# Patient Record
Sex: Male | Born: 1980 | Race: White | Hispanic: No | Marital: Married
Health system: Midwestern US, Academic
[De-identification: ages and names within clinical notes are randomized; demographics above are authoritative.]

---

## 2018-01-01 ENCOUNTER — Encounter: Admit: 2018-01-01 | Discharge: 2018-01-02

## 2018-01-01 DIAGNOSIS — R69 Illness, unspecified: Principal | ICD-10-CM

## 2018-02-05 ENCOUNTER — Encounter: Admit: 2018-02-05 | Discharge: 2018-02-05

## 2018-02-05 DIAGNOSIS — R69 Illness, unspecified: Principal | ICD-10-CM

## 2018-02-25 ENCOUNTER — Encounter: Admit: 2018-02-25 | Discharge: 2018-02-25

## 2018-03-04 ENCOUNTER — Encounter: Admit: 2018-03-04 | Discharge: 2018-03-04

## 2018-03-04 ENCOUNTER — Encounter: Admit: 2018-03-04 | Discharge: 2018-03-05

## 2018-03-04 DIAGNOSIS — R69 Illness, unspecified: Principal | ICD-10-CM

## 2018-03-05 ENCOUNTER — Encounter: Admit: 2018-03-05 | Discharge: 2018-03-05

## 2018-03-05 DIAGNOSIS — R51 Headache: Principal | ICD-10-CM

## 2018-03-05 DIAGNOSIS — H547 Unspecified visual loss: ICD-10-CM

## 2018-03-05 DIAGNOSIS — M79651 Pain in right thigh: Principal | ICD-10-CM

## 2018-03-05 DIAGNOSIS — R42 Dizziness and giddiness: Secondary | ICD-10-CM

## 2018-03-06 ENCOUNTER — Ambulatory Visit: Admit: 2018-03-05 | Discharge: 2018-03-06

## 2018-03-06 DIAGNOSIS — M79652 Pain in left thigh: ICD-10-CM

## 2018-03-06 DIAGNOSIS — Z8249 Family history of ischemic heart disease and other diseases of the circulatory system: ICD-10-CM

## 2018-03-06 DIAGNOSIS — R200 Anesthesia of skin: ICD-10-CM

## 2018-03-06 DIAGNOSIS — M5020 Other cervical disc displacement, unspecified cervical region: ICD-10-CM

## 2018-03-11 ENCOUNTER — Encounter: Admit: 2018-03-11 | Discharge: 2018-03-11

## 2018-03-20 ENCOUNTER — Encounter: Admit: 2018-03-20 | Discharge: 2018-03-20

## 2018-03-20 ENCOUNTER — Encounter: Admit: 2018-03-20 | Discharge: 2018-03-21

## 2018-03-26 ENCOUNTER — Encounter: Admit: 2018-03-26 | Discharge: 2018-03-26

## 2018-03-27 ENCOUNTER — Encounter: Admit: 2018-03-27 | Discharge: 2018-03-27

## 2018-05-15 ENCOUNTER — Encounter: Admit: 2018-05-15 | Discharge: 2018-05-15

## 2018-06-03 ENCOUNTER — Encounter: Admit: 2018-06-03 | Discharge: 2018-06-03
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
## 2022-06-26 ENCOUNTER — Encounter: Admit: 2022-06-26 | Discharge: 2022-06-26

## 2022-06-27 ENCOUNTER — Encounter: Admit: 2022-06-27 | Discharge: 2022-06-27

## 2022-06-27 ENCOUNTER — Ambulatory Visit: Admit: 2022-06-27 | Discharge: 2022-06-28

## 2022-06-27 DIAGNOSIS — R519 Generalized headaches: Secondary | ICD-10-CM

## 2022-06-27 DIAGNOSIS — T23262A Burn of second degree of back of left hand, initial encounter: Secondary | ICD-10-CM

## 2022-06-27 DIAGNOSIS — H547 Unspecified visual loss: Secondary | ICD-10-CM

## 2022-06-27 MED ORDER — BACITRACIN ZINC 500 UNIT/GRAM TP OINT
Freq: Once | TOPICAL | 0 refills | Status: CP
Start: 2022-06-27 — End: ?
  Administered 2022-06-27: 19:00:00 via TOPICAL
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
